# Patient Record
Sex: Male | Born: 1970 | Race: Black or African American | Hispanic: No | Smoking: Current some day smoker
Health system: Southern US, Community
[De-identification: ages and names within clinical notes are randomized; demographics above are authoritative.]

## PROBLEM LIST (undated history)

## (undated) DIAGNOSIS — M549 Dorsalgia, unspecified: Secondary | ICD-10-CM

## (undated) HISTORY — PX: NO PAST SURGERIES: SHX2092

## (undated) HISTORY — DX: Dorsalgia, unspecified: M54.9

---

## 2016-02-09 ENCOUNTER — Emergency Department (HOSPITAL_COMMUNITY): Payer: Self-pay

## 2016-02-09 ENCOUNTER — Encounter (HOSPITAL_COMMUNITY): Payer: Self-pay | Admitting: Emergency Medicine

## 2016-02-09 ENCOUNTER — Emergency Department (HOSPITAL_COMMUNITY)
Admission: EM | Admit: 2016-02-09 | Discharge: 2016-02-09 | Disposition: A | Discharge status: Home / Self Care | Payer: Self-pay | Attending: Physician Assistant | Admitting: Physician Assistant

## 2016-02-09 DIAGNOSIS — M545 Low back pain: Secondary | ICD-10-CM | POA: Insufficient documentation

## 2016-02-09 DIAGNOSIS — M546 Pain in thoracic spine: Secondary | ICD-10-CM | POA: Insufficient documentation

## 2016-02-09 DIAGNOSIS — Y999 Unspecified external cause status: Secondary | ICD-10-CM | POA: Insufficient documentation

## 2016-02-09 DIAGNOSIS — R930 Abnormal findings on diagnostic imaging of skull and head, not elsewhere classified: Secondary | ICD-10-CM | POA: Insufficient documentation

## 2016-02-09 DIAGNOSIS — W108XXA Fall (on) (from) other stairs and steps, initial encounter: Secondary | ICD-10-CM | POA: Insufficient documentation

## 2016-02-09 DIAGNOSIS — Y929 Unspecified place or not applicable: Secondary | ICD-10-CM | POA: Insufficient documentation

## 2016-02-09 DIAGNOSIS — Y939 Activity, unspecified: Secondary | ICD-10-CM | POA: Insufficient documentation

## 2016-02-09 DIAGNOSIS — W19XXXA Unspecified fall, initial encounter: Secondary | ICD-10-CM

## 2016-02-09 MED ORDER — CYCLOBENZAPRINE HCL 10 MG PO TABS: 10.0000 mg | ORAL_TABLET | Freq: Two times a day (BID) | ORAL | 0 refills | Status: DC | PRN

## 2016-02-09 MED ORDER — OXYCODONE-ACETAMINOPHEN 5-325 MG PO TABS
1.0000 | ORAL_TABLET | Freq: Once | ORAL | Status: AC
  Administered 2016-02-09: 1 via ORAL
  Filled 2016-02-09: qty 1

## 2016-02-09 MED ORDER — IBUPROFEN 800 MG PO TABS: 800.0000 mg | ORAL_TABLET | Freq: Three times a day (TID) | ORAL | 0 refills | Status: DC

## 2016-02-09 NOTE — Discharge Instructions (Signed)
You were seen today after fall. We believe you have multiple muscle sprains. Please use his muscle relaxer and ibuprofen to help with the pain.

## 2016-02-09 NOTE — ED Provider Notes (Signed)
[facility]ENCY DEPT Provider Note   CSN: [id number] Arrival date & time: 02/09/16  1024     History   Chief Complaint Chief Complaint  Patient presents with  . Fall    HPI Kenneth Hogan is a 46 y.o. male.  HPI   Patient is a [age] her old male who slipped and fell down 2 flights of stairs. Patient reports he fell down and lost urinary continence. Denies any weakness currently. No change in sensation. Patient does have pain in his back.  No loss of consciousness.  History reviewed. No pertinent past medical history.  There are no active problems to display for this patient.   History reviewed. No pertinent surgical history.     Home Medications    Prior to Admission medications   Medication Sig Start Date End Date Taking? Authorizing Provider  cyclobenzaprine (FLEXERIL) 10 MG tablet Take 1 tablet (10 mg total) by mouth 2 (two) times daily as needed for muscle spasms. 02/09/16   Angely Dietz Lyn Rune Mendez, MD  ibuprofen (ADVIL,MOTRIN) 800 MG tablet Take 1 tablet (800 mg total) by mouth 3 (three) times daily. 02/09/16   Bennette Hasty Lyn Balraj Brayfield, MD    Family History History reviewed. No pertinent family history.  Social History Social History  Substance Use Topics  . Smoking status: Never Smoker  . Smokeless tobacco: Never Used  . Alcohol use Not on file     Allergies   Patient has no allergy information on record.   Review of Systems Review of Systems  Constitutional: Negative for fever.  Musculoskeletal: Positive for back pain. Negative for gait problem.  Neurological: Negative for dizziness.     Physical Exam Updated Vital Signs BP 113/86 (BP Location: Left Arm)   Pulse 62   Resp 18   SpO2 100%   Physical Exam  Constitutional: He is oriented to person, place, and time. He appears well-nourished.  HENT:  Head: Normocephalic.  Eyes: Conjunctivae are normal.  Cardiovascular: Normal rate.   Pulmonary/Chest: Effort normal and breath sounds normal.    Abdominal: Soft. There is no tenderness.  Genitourinary: Penis normal.  Genitourinary Comments: Normal sensation.  Musculoskeletal: He exhibits no edema.  Pains T and L-spine.  Neurological: He is oriented to person, place, and time.  Equal strength bilaterally upper and lower extremities negative pronator drift. Normal sensation bilaterally. Speech comprehensible, no slurring. Facial nerve tested and appears grossly normal. Alert and oriented 3.   Sensation to sacrum and genitals. Normal sensation bilateral lower extremities.  Skin: Skin is warm and dry. He is not diaphoretic.  Psychiatric: He has a normal mood and affect. His behavior is normal.     ED Treatments / Results  Labs (all labs ordered are listed, but only abnormal results are displayed) Labs Reviewed - No data to display  EKG  EKG Interpretation None       Radiology Ct Head Wo Contrast  Result Date: 02/09/2016 CLINICAL DATA:  Slipped and fell down 2 flights of stairs. EXAM: CT HEAD WITHOUT CONTRAST TECHNIQUE: Contiguous axial images were obtained from the base of the skull through the vertex without intravenous contrast. COMPARISON:  None. FINDINGS: Brain: No evidence of malformation, atrophy, old or acute small or large vessel infarction, mass lesion, hemorrhage, hydrocephalus or extra-axial collection. No evidence of pituitary lesion. Vascular: No vascular calcification.  No hyperdense vessels. Skull: Normal.  No fracture or focal bone lesion. Sinuses/Orbits: Visualized sinuses are clear. No fluid in the middle ears or mastoids. Visualized orbits are normal. Other: None  significant IMPRESSION: Normal head CT Electronically Signed   By: Paulina Fusi M.D.   On: 02/09/2016 13:00   Ct Cervical Spine Wo Contrast  Result Date: 02/09/2016 CLINICAL DATA:  Slipped and fell down 2 flights of stairs. EXAM: CT CERVICAL SPINE WITHOUT CONTRAST TECHNIQUE: Multidetector CT imaging of the cervical spine was performed without  intravenous contrast. Multiplanar CT image reconstructions were also generated. COMPARISON:  None. FINDINGS: Alignment: Normal Skull base and vertebrae: No fracture or focal bone lesion. Soft tissues and spinal canal: Negative Disc levels: C2-3: Spondylosis with mild foraminal encroachment by osteophytes. C3-4: Spondylosis with foraminal encroachment by osteophytes left more than right. C4-5: Spondylosis with osteophytic encroachment upon the canal and the foramina right more than left. C5-6: Spondylosis with mild foraminal encroachment upon the foramina. C6-7: Spondylosis with mild foraminal encroachment left more than right. C7-T1:  Tiny osteophytes without stenosis. Upper chest: Emphysema.  No acute finding. Other: None significant IMPRESSION: No acute or traumatic finding.  Chronic degenerative spondylosis. Electronically Signed   By: Paulina Fusi M.D.   On: 02/09/2016 13:06   Ct Thoracic Spine Wo Contrast  Result Date: 02/09/2016 CLINICAL DATA:  Larey Seat down 2 flights of stairs.  Back pain. EXAM: CT THORACIC SPINE WITHOUT CONTRAST TECHNIQUE: Multidetector CT images of the thoracic were obtained using the standard protocol without intravenous contrast. COMPARISON:  None. FINDINGS: Alignment: Normal Vertebrae: No fracture or primary bone lesion. Paraspinal and other soft tissues: Negative. Emphysema noted in the upper lobes. Disc levels: No significant degenerative disc disease. Mild facet degeneration and ligamentous hypertrophy at T3-4. IMPRESSION: Negative thoracic study. Electronically Signed   By: Paulina Fusi M.D.   On: 02/09/2016 13:07   Ct Lumbar Spine Wo Contrast  Result Date: 02/09/2016 CLINICAL DATA:  Larey Seat down two flights of stairs.  Back pain. EXAM: CT LUMBAR SPINE WITHOUT CONTRAST TECHNIQUE: Multidetector CT imaging of the lumbar spine was performed without intravenous contrast administration. Multiplanar CT image reconstructions were also generated. COMPARISON:  None. FINDINGS: Segmentation: 5  lumbar type vertebral bodies. Alignment: Normal Vertebrae: No fracture or focal lesion. Paraspinal and other soft tissues: Nonobstructing left renal stone. Some atherosclerotic calcification of the aorta and iliac arteries. Disc levels: Disc bulges at L3-4, L4-5 and L5-S1. Lower lumbar facet arthropathy. Bilateral sacroiliac osteoarthritis. IMPRESSION: No acute or traumatic finding. Lower lumbar disc bulges and facet arthropathy. Sacroiliac osteoarthritis. Aortic atherosclerosis. Left renal stone. Electronically Signed   By: Paulina Fusi M.D.   On: 02/09/2016 13:10    Procedures Procedures (including critical care time)  Medications Ordered in ED Medications  oxyCODONE-acetaminophen (PERCOCET/ROXICET) 5-325 MG per tablet 1 tablet (1 tablet Oral Given 02/09/16 1355)     Initial Impression / Assessment and Plan / ED Course  I have reviewed the triage vital signs and the nursing notes.  Pertinent labs & imaging results that were available during my care of the patient were reviewed by me and considered in my medical decision making (see chart for details).     Patient is a 46 year old male who had a mechanical fall today. Patient fell down stairs after slipping on ice. Patient reports no weakness. Pain to the T and L-spine. Normal neurologic exam. Patient did urinate on himself but has normal sensation .   We'll get CT  CT and L-spine.  We not suspect spinal cord injury given normal neurologic exam and no symptoms.  NOrmal CTs, patietn ambulatory without issue.  Patient is comfortable, ambulatory, and taking PO at time of discharge.  Patient  expressed understanding about return precautions.   Final Clinical Impressions(s) / ED Diagnoses   Final diagnoses:  Fall, initial encounter    New Prescriptions Discharge Medication List as of 02/09/2016  2:37 PM    START taking these medications   Details  cyclobenzaprine (FLEXERIL) 10 MG tablet Take 1 tablet (10 mg total) by mouth 2 (two)  times daily as needed for muscle spasms., Starting Fri 02/09/2016, Print    ibuprofen (ADVIL,MOTRIN) 800 MG tablet Take 1 tablet (800 mg total) by mouth 3 (three) times daily., Starting Fri 02/09/2016, Print         Siyah Mault Randall An, MD 02/09/16 1539

## 2018-07-29 IMAGING — CT CT CERVICAL SPINE W/O CM
4 of 8 series · 13 of 33 positions shown, 14 images · non-contrast
Comparison: None.

CLINICAL DATA: Slipped and fell down 2 flights of stairs.

EXAM:
CT CERVICAL SPINE WITHOUT CONTRAST
TECHNIQUE: Multidetector CT imaging of the cervical spine was performed without
intravenous contrast. Multiplanar CT image reconstructions were also
generated.

[Series 203: coronal st, idose (1) · coronal · 0.40mm/px · 2 of 79 slices shown]
[im 27/79  bone]
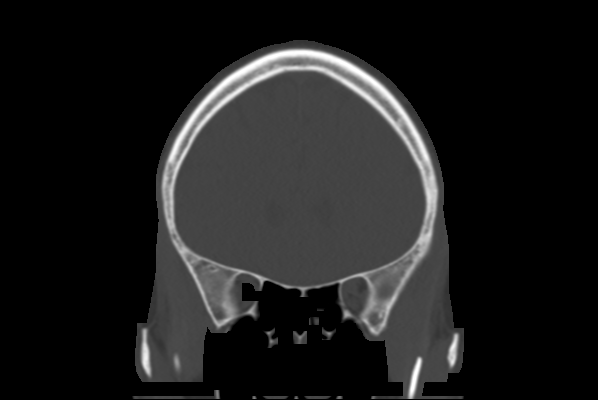
[im 53/79  bone]
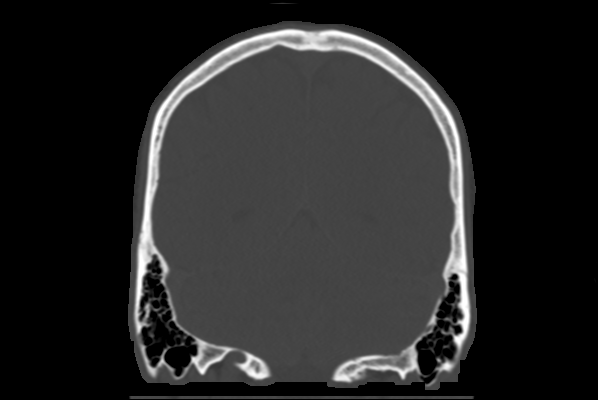

[Series 302: soft tissue, idose (2) · axial · 0.39mm/px · z∈[+125,+231]mm · 3 of 107 slices shown]
[im 27/107  soft-tissue]
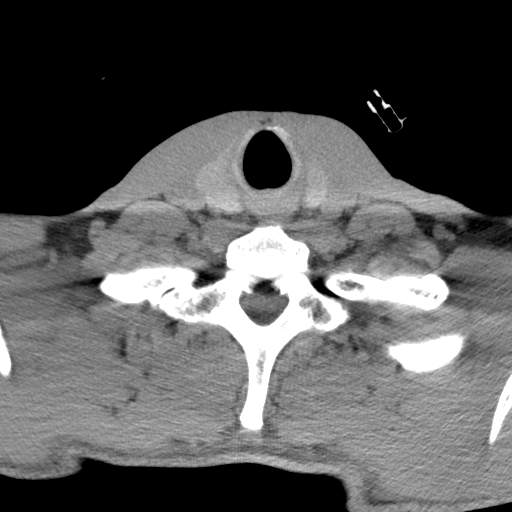
[im 54/107  soft-tissue]
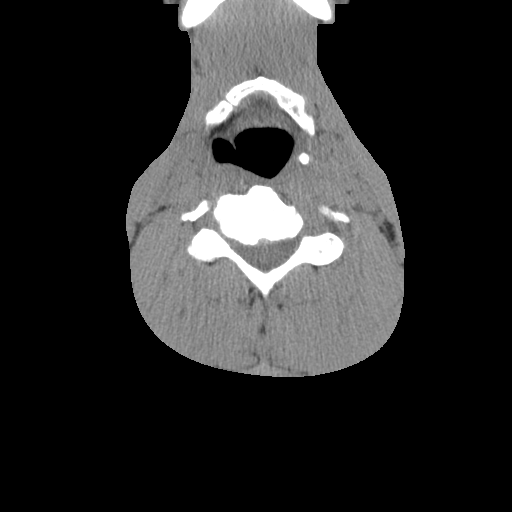
[im 80/107  soft-tissue]
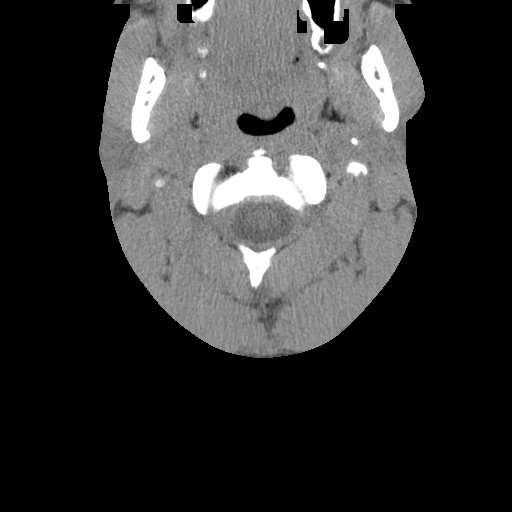

[Series 305: sagittal, idose (2) · sagittal · 0.34mm/px · 5 of 100 slices shown]
[im 17/100  bone]
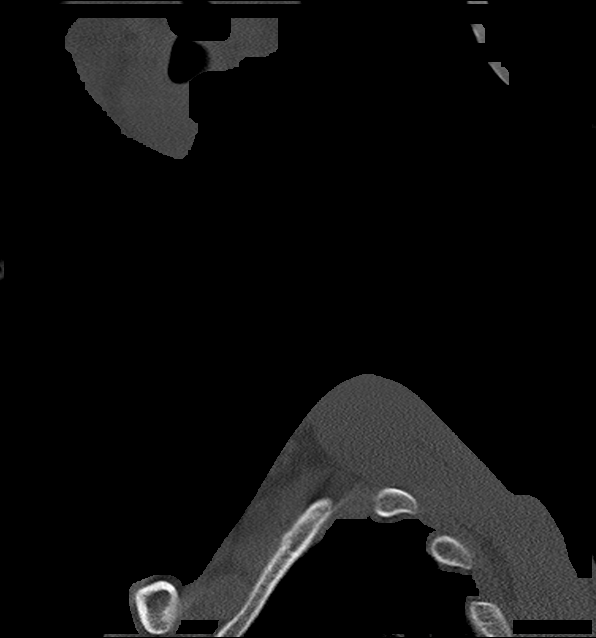
[im 34/100  bone]
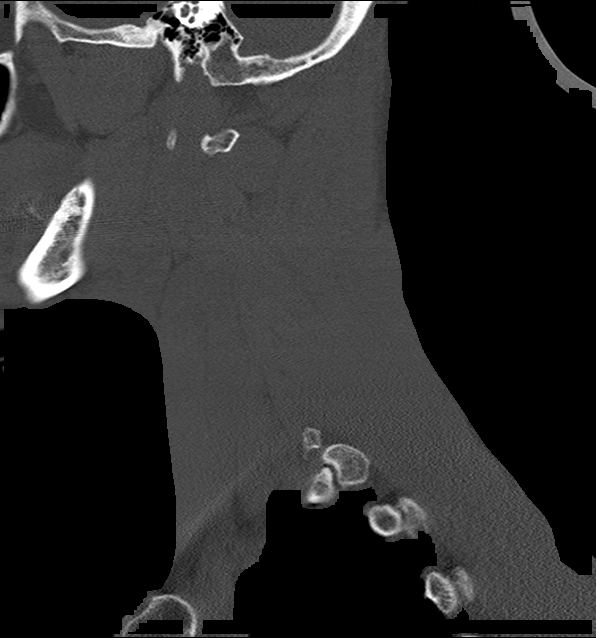
[im 50/100  bone]
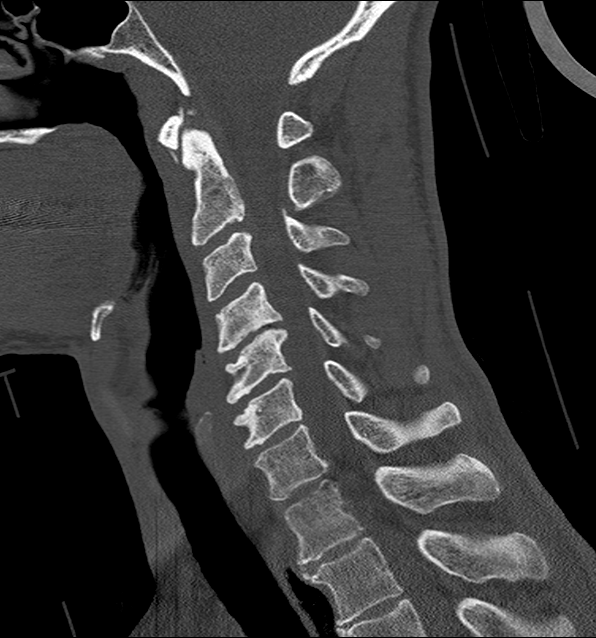
[im 67/100  bone]
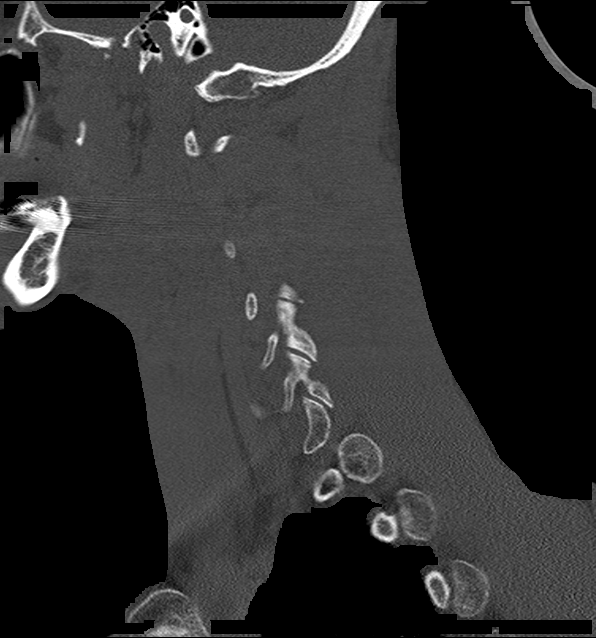
[im 83/100  bone]
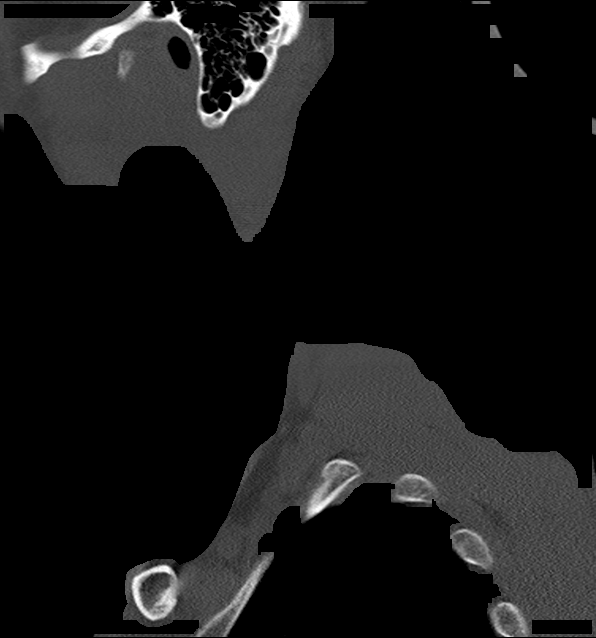

[Series 306: orthogonals, idose (2) · axial · 0.53mm/px · z∈[+88,+184]mm · 3 of 107 slices shown, 4 images]
[im 27/107  soft-tissue]
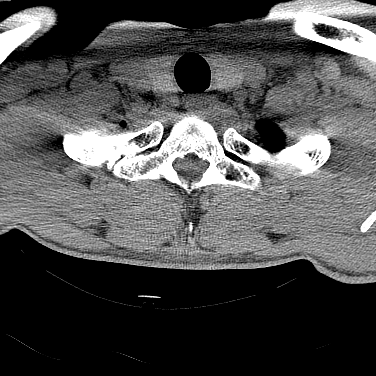
[im 27/107  bone]
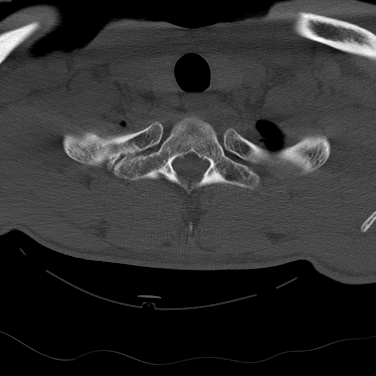
[im 54/107  bone]
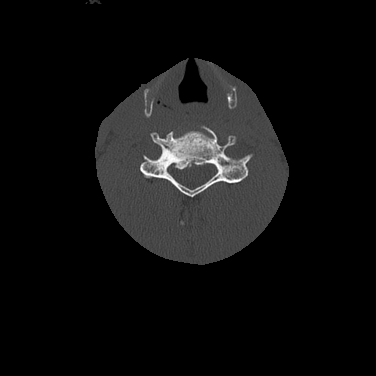
[im 80/107  bone]
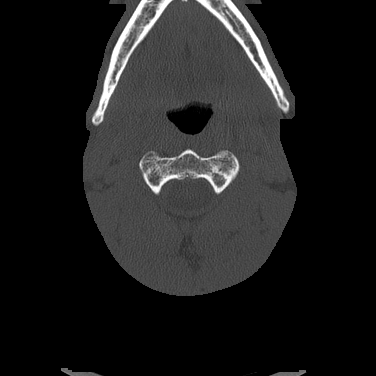

[13 of 33 positions shown; findings below may reference images not displayed]

FINDINGS: Alignment: Normal

Skull base and vertebrae: No fracture or focal bone lesion.

Soft tissues and spinal canal: Negative

Disc levels: C2-3: Spondylosis with mild foraminal encroachment by
osteophytes.

C3-4: Spondylosis with foraminal encroachment by osteophytes left
more than right.

C4-5: Spondylosis with osteophytic encroachment upon the canal and
the foramina right more than left.

C5-6: Spondylosis with mild foraminal encroachment upon the
foramina.

C6-7: Spondylosis with mild foraminal encroachment left more than
right.

C7-T1:  Tiny osteophytes without stenosis.

Upper chest: Emphysema.  No acute finding.

Other: None significant
IMPRESSION: No acute or traumatic finding.  Chronic degenerative spondylosis.

## 2018-10-12 ENCOUNTER — Encounter: Payer: Self-pay | Admitting: Nurse Practitioner

## 2018-10-12 ENCOUNTER — Ambulatory Visit: Payer: Self-pay | Attending: Nurse Practitioner | Admitting: Nurse Practitioner

## 2018-10-12 ENCOUNTER — Ambulatory Visit: Payer: Self-pay

## 2018-10-12 ENCOUNTER — Other Ambulatory Visit: Payer: Self-pay

## 2018-10-12 VITALS — Ht 72.0 in

## 2018-10-12 DIAGNOSIS — Z130 Encounter for screening for diseases of the blood and blood-forming organs and certain disorders involving the immune mechanism: Secondary | ICD-10-CM

## 2018-10-12 DIAGNOSIS — Z13220 Encounter for screening for lipoid disorders: Secondary | ICD-10-CM

## 2018-10-12 DIAGNOSIS — Z131 Encounter for screening for diabetes mellitus: Secondary | ICD-10-CM

## 2018-10-12 DIAGNOSIS — F17200 Nicotine dependence, unspecified, uncomplicated: Secondary | ICD-10-CM

## 2018-10-12 DIAGNOSIS — Z7689 Persons encountering health services in other specified circumstances: Secondary | ICD-10-CM

## 2018-10-12 DIAGNOSIS — G8929 Other chronic pain: Secondary | ICD-10-CM

## 2018-10-12 DIAGNOSIS — M545 Low back pain: Secondary | ICD-10-CM

## 2018-10-12 MED ORDER — NAPROXEN 500 MG PO TABS: 500.0000 mg | ORAL_TABLET | Freq: Two times a day (BID) | ORAL | 0 refills | Status: AC

## 2018-10-12 MED ORDER — GABAPENTIN 300 MG PO CAPS: 600.0000 mg | ORAL_CAPSULE | Freq: Three times a day (TID) | ORAL | 1 refills | Status: DC

## 2018-10-12 MED ORDER — DULOXETINE HCL 30 MG PO CPEP: 30.0000 mg | ORAL_CAPSULE | Freq: Every day | ORAL | 3 refills | Status: DC

## 2018-10-12 MED FILL — NAPROXEN 500 MG TABLET: 500 | 30 days supply | Qty: 60 | Fill #0

## 2018-10-12 MED FILL — DULoxetine HCL 30 MG CPEP: 30 | 30 days supply | Qty: 30 | Fill #0

## 2018-10-12 MED FILL — GABAPENTIN 300 MG CAPSULE: 300 | 30 days supply | Qty: 180 | Fill #0

## 2018-10-12 NOTE — Progress Notes (Signed)
Virtual Visit via Telephone Note Due to national recommendations of social distancing due to [place] 19, telehealth visit is felt to be most appropriate for this patient at this time.  I discussed the limitations, risks, security and privacy concerns of performing an evaluation and management service by telephone and the availability of in person appointments. I also discussed with the patient that there may be a patient responsible charge related to this service. The patient expressed understanding and agreed to proceed.    I connected with Almetta Lovely on 10/12/18  at   1:50 PM EDT  EDT by telephone and verified that I am speaking with the correct person using two identifiers.   Consent I discussed the limitations, risks, security and privacy concerns of performing an evaluation and management service by telephone and the availability of in person appointments. I also discussed with the patient that there may be a patient responsible charge related to this service. The patient expressed understanding and agreed to proceed.   Location of Patient: Private Residence    Location of Provider: [place] and [employer] Office    Persons participating in Telemedicine visit: Geryl Rankins FNP-BC [place]    History of Present Illness: Telemedicine visit for: Establish Care Denies any history HTN, HPL, DM or thyroid disorder He has been receiving [facility] at the [facility]   Chronic Back Pain Lower back. Onset over 15 years ago. He has applied for disability in the past and was denied. Currently uninsured. 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 He denies any significant trauma or injury in the past. Continuous nagging sharp pain.   Taking gabapentin and ibuprofen 800 mg for his back pain with little relief of pain. He has also tried muscle relaxants in the past with no relief.  Aggravating factors: twisting, prolonged walking and sitting. Sometimes  has to hold on the walls when back pain occurs as legs also get weak. He does endorses history of falls. He does not use a cane. He denies any burning, numbness or tingling.   He does have an ED visit note from 02-09-2016: fell down 2 flights of stair and lost control of urine as well.   CT LUMBAR SPINE Paraspinal and other soft tissues: Nonobstructing left renal stone. Some atherosclerotic calcification of the aorta and iliac arteries.  Disc levels: Disc bulges at L3-4, L4-5 and L5-S1. Lower lumbar facet arthropathy. Bilateral sacroiliac osteoarthritis.  IMPRESSION: No acute or traumatic finding. Lower lumbar disc bulges and facet arthropathy. Sacroiliac osteoarthritis.  Aortic atherosclerosis.  Left renal stone.   CT THORACIC SPINE Disc levels: No significant degenerative disc disease. Mild facet degeneration and ligamentous hypertrophy at T3-4.   CT Cervical Spine No acute or traumatic finding.  Chronic degenerative spondylosis.  Past Medical History:  Diagnosis Date  . Back pain     Past Surgical History:  Procedure Laterality Date  . NO PAST SURGERIES      Family History  Problem Relation Age of Onset  . Hypertension Neg Hx   . Diabetes Neg Hx     Social History   Socioeconomic History  . Marital status: Unknown    Spouse name: Not on file  . Number of children: Not on file  . Years of education: Not on file  . Highest education level: Not on file  Occupational History  . Not on file  Social Needs  . Financial resource strain: Not on file  . Food  insecurity    Worry: Not on file    Inability: Not on file  . Transportation needs    Medical: Not on file    Non-medical: Not on file  Tobacco Use  . Smoking status: Current Some Day Smoker    Types: Cigarettes  . Smokeless tobacco: Never Used  Substance and Sexual Activity  . Alcohol use: Not Currently  . Drug use: Not Currently  . Sexual activity: Yes  Lifestyle  . Physical activity    Days per  week: Not on file    Minutes per session: Not on file  . Stress: Not on file  Relationships  . Social Herbalist on phone: Not on file    Gets together: Not on file    Attends religious service: Not on file    Active member of club or organization: Not on file    Attends meetings of clubs or organizations: Not on file    Relationship status: Not on file  Other Topics Concern  . Not on file  Social History Narrative  . Not on file     Observations/Objective: Awake, alert and oriented x 3   Review of Systems  Constitutional: Negative for fever, malaise/fatigue and weight loss.  HENT: Negative.  Negative for nosebleeds.   Eyes: Negative.  Negative for blurred vision, double vision and photophobia.  Respiratory: Negative.  Negative for cough and shortness of breath.   Cardiovascular: Negative.  Negative for chest pain, palpitations and leg swelling.  Gastrointestinal: Negative.  Negative for heartburn, nausea and vomiting.  Musculoskeletal: Positive for back pain and falls. Negative for myalgias.  Neurological: Positive for sensory change. Negative for dizziness, focal weakness, seizures and headaches.  Psychiatric/Behavioral: Negative.  Negative for suicidal ideas.    Assessment and Plan: Merrick was seen today for new patient (initial visit).  Diagnoses and all orders for this visit:  Encounter to establish care -     CMP14+EGFR; Future  Screening for deficiency anemia -     CBC; Future  Lipid screening -     Lipid panel; Future  Diabetes mellitus screening -     Hemoglobin A1c; Future  Tobacco dependence  Chronic bilateral low back pain without sciatica -     DULoxetine (CYMBALTA) 30 MG capsule; Take 1 capsule (30 mg total) by mouth daily. -     gabapentin (NEURONTIN) 300 MG capsule; Take 2 capsules (600 mg total) by mouth 3 (three) times daily. -     naproxen (NAPROSYN) 500 MG tablet; Take 1 tablet (500 mg total) by mouth 2 (two) times daily with a meal.  CAN NOT TAKE WITH IBUPROFEN Work on losing weight to help reduce back pain. May alternate with heat and ice application for pain relief. May also alternate with acetaminophen as prescribed for back pain. Other alternatives include massage, acupuncture and water aerobics.  You must stay active and avoid a sedentary lifestyle.     Follow Up Instructions Return in about 6 weeks (around 11/23/2018) for BACK PAIN; , Needs appointment with financial representative..     I discussed the assessment and treatment plan with the patient. The patient was provided an opportunity to ask questions and all were answered. The patient agreed with the plan and demonstrated an understanding of the instructions.   The patient was advised to call back or seek an in-person evaluation if the symptoms worsen or if the condition fails to improve as anticipated.  I provided 24 minutes of non-face-to-face time  during this encounter including median intraservice time, reviewing previous notes, labs, imaging, medications and explaining diagnosis and management.  Zelda W Fleming, FNP-BC 

## 2018-10-13 LAB — CMP14+EGFR
ALT: 23 IU/L (ref 0–44)
AST: 23 IU/L (ref 0–40)
Albumin/Globulin Ratio: 2.2 (ref 1.2–2.2)
Albumin: 4.4 g/dL (ref 4.0–5.0)
Alkaline Phosphatase: 121 IU/L — ABNORMAL HIGH (ref 39–117)
BUN/Creatinine Ratio: 11 (ref 9–20)
BUN: 11 mg/dL (ref 6–24)
Bilirubin Total: 0.6 mg/dL (ref 0.0–1.2)
CO2: 27 mmol/L (ref 20–29)
Calcium: 9.6 mg/dL (ref 8.7–10.2)
Chloride: 100 mmol/L (ref 96–106)
Creatinine, Ser: 1 mg/dL (ref 0.76–1.27)
GFR calc Af Amer: 102 mL/min/{1.73_m2} (ref >59)
GFR calc non Af Amer: 88 mL/min/{1.73_m2} (ref >59)
Globulin, Total: 2 g/dL (ref 1.5–4.5)
Glucose: 93 mg/dL (ref 65–99)
Potassium: 4.9 mmol/L (ref 3.5–5.2)
Sodium: 140 mmol/L (ref 134–144)
Total Protein: 6.4 g/dL (ref 6.0–8.5)

## 2018-10-13 LAB — LIPID PANEL
Chol/HDL Ratio: 2.4 ratio (ref 0.0–5.0)
Cholesterol, Total: 188 mg/dL (ref 100–199)
HDL: 77 mg/dL (ref >39)
LDL Chol Calc (NIH): 82 mg/dL (ref 0–99)
Triglycerides: 178 mg/dL — ABNORMAL HIGH (ref 0–149)
VLDL Cholesterol Cal: 29 mg/dL (ref 5–40)

## 2018-10-13 LAB — HEMOGLOBIN A1C
Est. average glucose Bld gHb Est-mCnc: 111 mg/dL
Hgb A1c MFr Bld: 5.5 % (ref 4.8–5.6)

## 2018-10-13 LAB — CBC
Hematocrit: 50.3 % (ref 37.5–51.0)
Hemoglobin: 16.5 g/dL (ref 13.0–17.7)
MCH: 30.8 pg (ref 26.6–33.0)
MCHC: 32.8 g/dL (ref 31.5–35.7)
MCV: 94 fL (ref 79–97)
Platelets: 244 10*3/uL (ref 150–450)
RBC: 5.35 x10E6/uL (ref 4.14–5.80)
RDW: 13.2 % (ref 11.6–15.4)
WBC: 6.7 10*3/uL (ref 3.4–10.8)

## 2018-10-21 ENCOUNTER — Ambulatory Visit: Payer: Medicaid Other

## 2018-10-21 ENCOUNTER — Telehealth: Payer: Self-pay | Admitting: Nurse Practitioner

## 2018-10-21 ENCOUNTER — Ambulatory Visit: Payer: Self-pay | Attending: Family Medicine

## 2018-10-21 ENCOUNTER — Other Ambulatory Visit: Payer: Self-pay

## 2018-10-21 NOTE — Telephone Encounter (Signed)
I ca the Pt at 9am to try to go over the interview for the CAFA and OC program, LVM to Pt that I will try again in 10 minutes and he does not answer the appt will count as a No show

## 2018-10-21 NOTE — Telephone Encounter (Signed)
I call the Pt to remind him that he has a 9am tele visit with financial, LVM

## 2018-11-05 ENCOUNTER — Telehealth: Payer: Self-pay | Admitting: Nurse Practitioner

## 2018-11-05 NOTE — Telephone Encounter (Signed)
Pt was sent a letter from financial dept. Inform them, that the application they submitted was incomplete, since they were missing some documentation at the time of the appointment, Pt need to reschedule and resubmit all new papers and application for CAFA and OC, P.S. old documents has been sent back by mail to the Pt and Pt. need to make a new appt. 

## 2019-08-30 ENCOUNTER — Emergency Department (HOSPITAL_COMMUNITY)
Admission: EM | Admit: 2019-08-30 | Discharge: 2019-08-30 | Disposition: A | Discharge status: Home / Self Care | Payer: Self-pay | Attending: Emergency Medicine | Admitting: Emergency Medicine

## 2019-08-30 ENCOUNTER — Encounter (HOSPITAL_COMMUNITY): Payer: Self-pay | Admitting: Emergency Medicine

## 2019-08-30 ENCOUNTER — Other Ambulatory Visit: Payer: Self-pay

## 2019-08-30 ENCOUNTER — Emergency Department (HOSPITAL_COMMUNITY): Payer: Self-pay

## 2019-08-30 DIAGNOSIS — M79604 Pain in right leg: Secondary | ICD-10-CM | POA: Insufficient documentation

## 2019-08-30 DIAGNOSIS — M79651 Pain in right thigh: Secondary | ICD-10-CM | POA: Insufficient documentation

## 2019-08-30 DIAGNOSIS — M5432 Sciatica, left side: Secondary | ICD-10-CM | POA: Insufficient documentation

## 2019-08-30 DIAGNOSIS — M79652 Pain in left thigh: Secondary | ICD-10-CM | POA: Insufficient documentation

## 2019-08-30 DIAGNOSIS — G8929 Other chronic pain: Secondary | ICD-10-CM | POA: Insufficient documentation

## 2019-08-30 DIAGNOSIS — M79605 Pain in left leg: Secondary | ICD-10-CM | POA: Insufficient documentation

## 2019-08-30 DIAGNOSIS — R531 Weakness: Secondary | ICD-10-CM | POA: Insufficient documentation

## 2019-08-30 DIAGNOSIS — F17210 Nicotine dependence, cigarettes, uncomplicated: Secondary | ICD-10-CM | POA: Insufficient documentation

## 2019-08-30 DIAGNOSIS — M5431 Sciatica, right side: Secondary | ICD-10-CM | POA: Insufficient documentation

## 2019-08-30 MED ORDER — HYDROCODONE-ACETAMINOPHEN 5-325 MG PO TABS
1.0000 | ORAL_TABLET | Freq: Once | ORAL | Status: AC
  Administered 2019-08-30: 1 via ORAL
  Filled 2019-08-30: qty 1

## 2019-08-30 MED ORDER — KETOROLAC TROMETHAMINE 60 MG/2ML IM SOLN
60.0000 mg | Freq: Once | INTRAMUSCULAR | Status: AC
  Administered 2019-08-30: 60 mg via INTRAMUSCULAR
  Filled 2019-08-30: qty 2

## 2019-08-30 MED ORDER — METHOCARBAMOL 500 MG PO TABS: 500.0000 mg | ORAL_TABLET | Freq: Two times a day (BID) | ORAL | 0 refills | Status: DC

## 2019-08-30 MED ORDER — CYCLOBENZAPRINE HCL 10 MG PO TABS
10.0000 mg | ORAL_TABLET | Freq: Once | ORAL | Status: AC
  Administered 2019-08-30: 10 mg via ORAL
  Filled 2019-08-30: qty 1

## 2019-08-30 NOTE — Discharge Planning (Signed)
Leo Fray J. Lucretia Roers, RN, BSN, [place] [phone]  RNCM set up appointment with [facility] on 9/14 @ 2:30.  Spoke with pt at bedside and advised to please arrive 15 min early and take a picture ID and your current medications.  Pt verbalizes understanding of keeping appointment.

## 2019-08-30 NOTE — ED Triage Notes (Signed)
Pt. Stated, Kenneth Hogan had back pain for a week and radiates to my butt and my legs.

## 2019-08-30 NOTE — ED Provider Notes (Addendum)
[facility] EMERGENCY DEPARTMENT Provider Note   CSN: [id number] Arrival date & time: 08/30/19  2202     History Chief Complaint  Patient presents with  . Back Pain    Estle Huguley is a 49 y.o. male.  The history is provided by the patient.  Back Pain Location:  Gluteal region Quality:  Shooting Radiates to:  R thigh and L thigh Pain severity:  Moderate Pain is:  Same all the time Duration:  2 days Timing:  Constant Progression:  Worsening Chronicity:  Recurrent Context: falling   Relieved by:  Nothing Worsened by:  Ambulation, bending and movement Ineffective treatments:  None tried Associated symptoms: no abdominal pain, no chest pain, no dysuria and no fever   Risk factors: no hx of cancer, no hx of osteoporosis and no lack of exercise    49 year old male with history of chronic back pain presents with acute on chronic back pain.  Patient states that he has been having left-sided back pain for the past few years and had an MRI done back in 2017 which showed "spinal stenosis".  Patient states that he fell couple days ago and landed on his hands and since then he has been having worsening left-sided as well as right-sided back pain that radiates down the back of both of his legs.  He also endorses tingling sensation in his bilateral lower extremities as well as weakness difficulty ambulating secondary to pain and weakness.  Denies bladder, bowel incontinence, IV drug use, history of cancer, recent direct trauma to the back.    Past Medical History:  Diagnosis Date  . Back pain     There are no problems to display for this patient.   Past Surgical History:  Procedure Laterality Date  . NO PAST SURGERIES         Family History  Problem Relation Age of Onset  . Hypertension Neg Hx   . Diabetes Neg Hx     Social History   Tobacco Use  . Smoking status: Current Some Day Smoker    Types: Cigarettes  . Smokeless tobacco: Never Used  Vaping Use   . Vaping Use: Never used  Substance Use Topics  . Alcohol use: Not Currently  . Drug use: Not Currently    Home Medications Prior to Admission medications   Medication Sig Start Date End Date Taking? Authorizing Provider  cyclobenzaprine (FLEXERIL) 10 MG tablet Take 1 tablet (10 mg total) by mouth 2 (two) times daily as needed for muscle spasms. Patient not taking: Reported on 10/12/2018 02/09/16   Mackuen, Courteney Lyn, MD  DULoxetine (CYMBALTA) 30 MG capsule Take 1 capsule (30 mg total) by mouth daily. 10/12/18 11/11/18  Claiborne Rigg, NP  gabapentin (NEURONTIN) 300 MG capsule Take 2 capsules (600 mg total) by mouth 3 (three) times daily. 10/12/18 11/11/18  Claiborne Rigg, NP  methocarbamol (ROBAXIN) 500 MG tablet Take 1 tablet (500 mg total) by mouth 2 (two) times daily. 08/30/19   Rickey Primus, MD    Allergies    Patient has no known allergies.  Review of Systems   Review of Systems  Constitutional: Negative for chills and fever.  HENT: Negative for ear pain and sore throat.   Eyes: Negative for pain and visual disturbance.  Respiratory: Negative for cough and shortness of breath.   Cardiovascular: Negative for chest pain and palpitations.  Gastrointestinal: Negative for abdominal pain and vomiting.  Genitourinary: Negative for dysuria and hematuria.  Musculoskeletal: Positive for  back pain. Negative for arthralgias.  Skin: Negative for color change and rash.  Neurological: Negative for seizures and syncope.  All other systems reviewed and are negative.   Physical Exam Updated Vital Signs BP 132/82 (BP Location: Right Arm)   Pulse 74   Temp 98.7 F (37.1 C) (Oral)   Resp 16   Ht 6' (1.829 m)   Wt 77.1 kg   SpO2 100%   BMI 23.06 kg/m   Physical Exam Vitals and nursing note reviewed.  Constitutional:      Appearance: He is well-developed.  HENT:     Head: Normocephalic and atraumatic.  Eyes:     Conjunctiva/sclera: Conjunctivae normal.  Cardiovascular:      Rate and Rhythm: Normal rate and regular rhythm.     Heart sounds: No murmur heard.   Pulmonary:     Effort: Pulmonary effort is normal. No respiratory distress.     Breath sounds: Normal breath sounds.  Abdominal:     Palpations: Abdomen is soft.     Tenderness: There is no abdominal tenderness.  Musculoskeletal:     Cervical back: Neck supple.  Skin:    General: Skin is warm and dry.  Neurological:     Mental Status: He is alert and oriented to person, place, and time.     Motor: Weakness (4/5 strength in bl LE.) present.     Comments: Decreased posterior sensation in BLE.  No saddle anesthesia.       ED Results / Procedures / Treatments   Labs (all labs ordered are listed, but only abnormal results are displayed) Labs Reviewed - No data to display  EKG None  Radiology MR LUMBAR SPINE WO CONTRAST  Result Date: 08/30/2019 CLINICAL DATA:  Low back and right leg pain worsening over the past week. EXAM: MRI LUMBAR SPINE WITHOUT CONTRAST TECHNIQUE: Multiplanar, multisequence MR imaging of the lumbar spine was performed. No intravenous contrast was administered. COMPARISON:  CT scan 02/09/2016 FINDINGS: Examination is limited. Patient was in a lot of pain and could not continue the exam. There are only 2 reasonable sequences which is the T2 and T1 sagittals. There are no axial images. Segmentation: There are five lumbar type vertebral bodies. The last full intervertebral disc space is labeled L5-S1. Alignment:  Normal Vertebrae:  Normal marrow signal. No bone lesions or fractures. Conus medullaris and cauda equina: Conus extends to the bottom of L1 level. Conus and cauda equina appear normal. Paraspinal and other soft tissues: No significant paraspinal findings Disc levels: L1-2: No significant findings. L2-3: Bulging annulus with mild impression on the thecal sac. L3-4: Bulging annulus with mild impression on the thecal sac. L4-5: Annular fissure and shallow central disc protrusion.  There is also an underlying bulging annulus. Mild mass effect on the thecal sac. L5-S1: Moderate to large central and right paracentral disc protrusion likely causing the patient's symptoms. No significant foraminal stenosis. IMPRESSION: 1. Limited examination as discussed above. 2. Moderate to large central and right paracentral disc protrusion at L5-S1 likely causing the patient's symptoms. 3. Annular fissure and shallow central disc protrusion at L4-5. Electronically Signed   By: Rudie Meyer M.D.   On: 08/30/2019 12:43    Procedures Procedures (including critical care time)  Medications Ordered in ED Medications  cyclobenzaprine (FLEXERIL) tablet 10 mg (10 mg Oral Given 08/30/19 1031)  ketorolac (TORADOL) injection 60 mg (60 mg Intramuscular Given 08/30/19 1031)  HYDROcodone-acetaminophen (NORCO/VICODIN) 5-325 MG per tablet 1 tablet (1 tablet Oral Given 08/30/19 1158)  ED Course  I have reviewed the triage vital signs and the nursing notes.  Pertinent labs & imaging results that were available during my care of the patient were reviewed by me and considered in my medical decision making (see chart for details).    MDM Rules/Calculators/A&P                          49 year old male presents with acute on chronic back pain.  Afebrile vital signs stable.  Exam as above.  Exam most notable for weakness as well as decreased sensation in the bilateral lower extremities.  Upon chart review patient had an MRI done back in 2018 that showed disc bulge at L3-L4, L4-L5, L5-S1.  Patient was scheduled to follow-up however stated that this was not financially possible for him.  Given patient's weakness as well as altered sensation on physical exam feel it is reasonable to repeat an L spine MRI.    MRI showed moderate to large central and right paracentral disc protrusion at L5-S1.  No evidence of foraminal stenosis or cauda equina on MRI.  Reevaluation patient endorsed improved pain however still  complaining of low back pain.  Spoke with social worker who helps set up an appointment for the patient on 9/14.  Do not feel that patient requires urgent spine consult at this time given minimal weakness and no evidence of stenosis on MRI.  Patient will be discharged with ibuprofen as well as Robaxin for his pain and he was encouraged to follow-up with his primary care physician.  Patient was also given strict ED return precautions.  Patient voiced agreement and understanding's overall plan.  Patient was discharged in stable condition without further events.  Final Clinical Impression(s) / ED Diagnoses Final diagnoses:  Bilateral sciatica    Rx / DC Orders ED Discharge Orders         Ordered    methocarbamol (ROBAXIN) 500 MG tablet  2 times daily     Discontinue  Reprint     08/30/19 1306           Rickey Primus, MD 08/30/19 1348    Rickey Primus, MD 08/30/19 1401    Gerhard Munch, MD 08/31/19 1016

## 2019-08-30 NOTE — ED Notes (Signed)
Pt transported to MRI 

## 2019-08-30 NOTE — Discharge Instructions (Addendum)
Please take one tablet of robaxin for back pain when needed and please follow up with your PCP at the appt noted above.  Please also take 400mg  of Ibuprofen every 8 hours for pain.

## 2019-10-05 ENCOUNTER — Encounter: Payer: Self-pay | Admitting: Nurse Practitioner

## 2019-10-05 ENCOUNTER — Ambulatory Visit: Payer: Self-pay | Attending: Nurse Practitioner | Admitting: Nurse Practitioner

## 2019-10-05 DIAGNOSIS — F17210 Nicotine dependence, cigarettes, uncomplicated: Secondary | ICD-10-CM | POA: Insufficient documentation

## 2019-10-05 DIAGNOSIS — M5126 Other intervertebral disc displacement, lumbar region: Secondary | ICD-10-CM | POA: Insufficient documentation

## 2019-10-05 DIAGNOSIS — R531 Weakness: Secondary | ICD-10-CM | POA: Insufficient documentation

## 2019-10-05 DIAGNOSIS — G8929 Other chronic pain: Secondary | ICD-10-CM | POA: Insufficient documentation

## 2019-10-05 DIAGNOSIS — M545 Low back pain: Secondary | ICD-10-CM | POA: Insufficient documentation

## 2019-10-05 MED ORDER — DULOXETINE HCL 60 MG PO CPEP: 60.0000 mg | ORAL_CAPSULE | Freq: Every day | ORAL | 1 refills | Status: AC

## 2019-10-05 MED ORDER — METHOCARBAMOL 750 MG PO TABS
750.0000 mg | ORAL_TABLET | Freq: Two times a day (BID) | ORAL | 1 refills | Status: AC
Start: 2019-10-05 — End: 2019-11-04

## 2019-10-05 MED ORDER — GABAPENTIN 300 MG PO CAPS: 600.0000 mg | ORAL_CAPSULE | Freq: Three times a day (TID) | ORAL | 1 refills | Status: AC

## 2019-10-05 MED FILL — DULoxetine HCL 60 MG CPEP: 60 | 30 days supply | Qty: 30 | Fill #0

## 2019-10-05 MED FILL — METHOCARBAMOL 750 MG TABS: 750 | 30 days supply | Qty: 60 | Fill #0

## 2019-10-05 MED FILL — GABAPENTIN 300 MG CAPSULE: 300 | 30 days supply | Qty: 180 | Fill #0

## 2019-10-05 NOTE — Progress Notes (Signed)
Virtual Visit via Telephone Note Due to national recommendations of social distancing due to COVID 19, telehealth visit is felt to be most appropriate for this patient at this time.  I discussed the limitations, risks, security and privacy concerns of performing an evaluation and management service by telephone and the availability of in person appointments. I also discussed with the patient that there may be a patient responsible charge related to this service. The patient expressed understanding and agreed to proceed.    I connected with Kenneth Hogan on 10/05/19  at   2:30 PM EDT  EDT by telephone and verified that I am speaking with the correct person using two identifiers.   Consent I discussed the limitations, risks, security and privacy concerns of performing an evaluation and management service by telephone and the availability of in person appointments. I also discussed with the patient that there may be a patient responsible charge related to this service. The patient expressed understanding and agreed to proceed.   Location of Patient: Private Residence    Location of Provider: [facility]    Persons participating in Telemedicine visit: Kenneth Denver FNP-BC YY Veyo CMA Eulis Belmar    History of Present Illness: Telemedicine visit for: Re Establish Care  He has a history of chronic back pain with bilateral sciatica.  Onset over 15 years ago. 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 He fell down a flight of stairs in 2018. He did fall last month and believes this may have exacerbated his pain. Will need to see the spine specialist once he is approved for CAFA. He can not afford to pay OOP. Pain is described as sharp. Aggravating factors: twisting, prolonged walking and sitting. Has to hold on to the walls when pain back occurs as well as weakness in legs. Denies any involuntary loss of urine of stool  MRI of  Lumbar Spine 08-2019 2. Moderate to large central and right paracentral disc protrusion at L5-S1 likely causing the patient's symptoms. 3. Annular fissure and shallow central disc protrusion at L4-5.  Medications tried in the past: Cymbalta, gabapentin, flexeril, naproxen. He can not recall if these were beneficial or not in relieving his back pain as he was lost to follow up. He was recently prescribed methocarbamol however he states he did not pick this up as it was not available at his pharmacy.  Past Medical History:  Diagnosis Date   Back pain     Past Surgical History:  Procedure Laterality Date   NO PAST SURGERIES      Family History  Problem Relation Age of Onset   Hypertension Neg Hx    Diabetes Neg Hx     Social History   Socioeconomic History   Marital status: Unknown    Spouse name: Not on file   Number of children: Not on file   Years of education: Not on file   Highest education level: Not on file  Occupational History   Not on file  Tobacco Use   Smoking status: Current Some Day Smoker    Types: Cigarettes   Smokeless tobacco: Never Used  Vaping Use   Vaping Use: Never used  Substance and Sexual Activity   Alcohol use: Not Currently   Drug use: Not Currently   Sexual activity: Yes  Other Topics Concern   Not on file  Social History Narrative   Not on file   Social Determinants  of Health   Financial Resource Strain:    Difficulty of Paying Living Expenses: Not on file  Food Insecurity:    Worried About Running Out of Food in the Last Year: Not on file   Ran Out of Food in the Last Year: Not on file  Transportation Needs:    Lack of Transportation (Medical): Not on file   Lack of Transportation (Non-Medical): Not on file  Physical Activity:    Days of Exercise per Week: Not on file   Minutes of Exercise per Session: Not on file  Stress:    Feeling of Stress : Not on file  Social Connections:    Frequency of  Communication with Friends and Family: Not on file   Frequency of Social Gatherings with Friends and Family: Not on file   Attends Religious Services: Not on file   Active Member of Clubs or Organizations: Not on file   Attends Banker Meetings: Not on file   Marital Status: Not on file     Observations/Objective: Awake, alert and oriented x 3   Review of Systems  Constitutional: Negative for fever, malaise/fatigue and weight loss.  HENT: Negative.  Negative for nosebleeds.   Eyes: Negative.  Negative for blurred vision, double vision and photophobia.  Respiratory: Negative.  Negative for cough and shortness of breath.   Cardiovascular: Negative.  Negative for chest pain, palpitations and leg swelling.  Gastrointestinal: Negative.  Negative for heartburn, nausea and vomiting.  Genitourinary: Negative.   Musculoskeletal: Positive for back pain. Negative for myalgias.  Neurological: Positive for sensory change. Negative for dizziness, focal weakness, seizures and headaches.  Psychiatric/Behavioral: Negative.  Negative for suicidal ideas.    Assessment and Plan: Kenneth Hogan was seen today for back pain.  Diagnoses and all orders for this visit:  Chronic bilateral low back pain without sciatica -     DULoxetine (CYMBALTA) 60 MG capsule; Take 1 capsule (60 mg total) by mouth daily. -     gabapentin (NEURONTIN) 300 MG capsule; Take 2 capsules (600 mg total) by mouth 3 (three) times daily. -     methocarbamol (ROBAXIN) 750 MG tablet; Take 1 tablet (750 mg total) by mouth 2 (two) times daily. Work on losing weight to help reduce back pain. May alternate with heat and ice application for pain relief. May also alternate with acetaminophen as prescribed for back pain. Other alternatives include massage, acupuncture and water aerobics.  You must stay active and avoid a sedentary lifestyle.     Follow Up Instructions Return in about 6 weeks (around 11/16/2019).     I discussed  the assessment and treatment plan with the patient. The patient was provided an opportunity to ask questions and all were answered. The patient agreed with the plan and demonstrated an understanding of the instructions.   The patient was advised to call back or seek an in-person evaluation if the symptoms worsen or if the condition fails to improve as anticipated.  I provided 19 minutes of non-face-to-face time during this encounter including median intraservice time, reviewing previous notes, labs, imaging, medications and explaining diagnosis and management.  Claiborne Rigg, FNP-BC

## 2019-10-06 ENCOUNTER — Other Ambulatory Visit: Payer: Self-pay

## 2019-10-06 ENCOUNTER — Ambulatory Visit: Payer: Self-pay | Attending: Family Medicine

## 2019-10-06 DIAGNOSIS — Z7689 Persons encountering health services in other specified circumstances: Secondary | ICD-10-CM

## 2019-10-06 DIAGNOSIS — Z13220 Encounter for screening for lipoid disorders: Secondary | ICD-10-CM

## 2019-10-06 DIAGNOSIS — Z13228 Encounter for screening for other metabolic disorders: Secondary | ICD-10-CM

## 2019-10-06 DIAGNOSIS — Z130 Encounter for screening for diseases of the blood and blood-forming organs and certain disorders involving the immune mechanism: Secondary | ICD-10-CM

## 2019-10-06 DIAGNOSIS — Z114 Encounter for screening for human immunodeficiency virus [HIV]: Secondary | ICD-10-CM

## 2019-10-06 DIAGNOSIS — Z1211 Encounter for screening for malignant neoplasm of colon: Secondary | ICD-10-CM

## 2019-10-06 DIAGNOSIS — Z131 Encounter for screening for diabetes mellitus: Secondary | ICD-10-CM

## 2019-10-06 DIAGNOSIS — Z1159 Encounter for screening for other viral diseases: Secondary | ICD-10-CM

## 2019-10-07 LAB — CBC
Hematocrit: 50.9 % (ref 37.5–51.0)
Hemoglobin: 16.9 g/dL (ref 13.0–17.7)
MCH: 30.3 pg (ref 26.6–33.0)
MCHC: 33.2 g/dL (ref 31.5–35.7)
MCV: 91 fL (ref 79–97)
Platelets: 257 10*3/uL (ref 150–450)
RBC: 5.58 x10E6/uL (ref 4.14–5.80)
RDW: 12.7 % (ref 11.6–15.4)
WBC: 4.7 10*3/uL (ref 3.4–10.8)

## 2019-10-07 LAB — CMP14+EGFR
ALT: 17 IU/L (ref 0–44)
AST: 18 IU/L (ref 0–40)
Albumin/Globulin Ratio: 1.9 (ref 1.2–2.2)
Albumin: 5 g/dL (ref 4.0–5.0)
Alkaline Phosphatase: 141 IU/L — ABNORMAL HIGH (ref 44–121)
BUN/Creatinine Ratio: 13 (ref 9–20)
BUN: 14 mg/dL (ref 6–24)
Bilirubin Total: 0.4 mg/dL (ref 0.0–1.2)
CO2: 26 mmol/L (ref 20–29)
Calcium: 10.2 mg/dL (ref 8.7–10.2)
Chloride: 100 mmol/L (ref 96–106)
Creatinine, Ser: 1.04 mg/dL (ref 0.76–1.27)
GFR calc Af Amer: 96 mL/min/{1.73_m2} (ref >59)
GFR calc non Af Amer: 83 mL/min/{1.73_m2} (ref >59)
Globulin, Total: 2.6 g/dL (ref 1.5–4.5)
Glucose: 87 mg/dL (ref 65–99)
Potassium: 5 mmol/L (ref 3.5–5.2)
Sodium: 141 mmol/L (ref 134–144)
Total Protein: 7.6 g/dL (ref 6.0–8.5)

## 2019-10-07 LAB — HEPATITIS C ANTIBODY: Hep C Virus Ab: <0.1 s/co ratio (ref 0.0–0.9)

## 2019-10-07 LAB — HEMOGLOBIN A1C
Est. average glucose Bld gHb Est-mCnc: 111 mg/dL
Hgb A1c MFr Bld: 5.5 % (ref 4.8–5.6)

## 2019-10-07 LAB — LIPID PANEL
Chol/HDL Ratio: 2.7 ratio (ref 0.0–5.0)
Cholesterol, Total: 221 mg/dL — ABNORMAL HIGH (ref 100–199)
HDL: 82 mg/dL (ref >39)
LDL Chol Calc (NIH): 117 mg/dL — ABNORMAL HIGH (ref 0–99)
Triglycerides: 126 mg/dL (ref 0–149)
VLDL Cholesterol Cal: 22 mg/dL (ref 5–40)

## 2019-10-07 LAB — HIV ANTIBODY (ROUTINE TESTING W REFLEX): HIV Screen 4th Generation wRfx: NONREACTIVE

## 2019-10-11 ENCOUNTER — Other Ambulatory Visit: Payer: Self-pay | Admitting: Nurse Practitioner

## 2019-10-11 MED ORDER — ATORVASTATIN CALCIUM 20 MG PO TABS: 20.0000 mg | ORAL_TABLET | Freq: Every day | ORAL | 3 refills | Status: AC

## 2019-10-12 ENCOUNTER — Telehealth: Payer: Self-pay | Admitting: *Deleted

## 2019-10-12 NOTE — Telephone Encounter (Signed)
Patient requested call from Financial Counselor, Mikle Bosworth regarding questions about the information needed to submit packet.   Pls advise.

## 2019-10-13 NOTE — Telephone Encounter (Signed)
Pt was call and explain what he need to collect to apply for the CAFA and OC program

## 2019-11-16 ENCOUNTER — Ambulatory Visit: Payer: Medicaid Other | Admitting: Nurse Practitioner

## 2019-12-12 ENCOUNTER — Emergency Department (HOSPITAL_COMMUNITY)
Admission: EM | Admit: 2019-12-12 | Discharge: 2019-12-12 | Disposition: A | Discharge status: Home / Self Care | Payer: Medicaid Other | Attending: Emergency Medicine | Admitting: Emergency Medicine

## 2019-12-12 ENCOUNTER — Other Ambulatory Visit: Payer: Self-pay

## 2019-12-12 ENCOUNTER — Encounter (HOSPITAL_COMMUNITY): Payer: Self-pay | Admitting: *Deleted

## 2019-12-12 DIAGNOSIS — M5136 Other intervertebral disc degeneration, lumbar region: Secondary | ICD-10-CM

## 2019-12-12 DIAGNOSIS — F17210 Nicotine dependence, cigarettes, uncomplicated: Secondary | ICD-10-CM | POA: Insufficient documentation

## 2019-12-12 DIAGNOSIS — M545 Low back pain, unspecified: Secondary | ICD-10-CM | POA: Insufficient documentation

## 2019-12-12 MED ORDER — LIDOCAINE 5 % EX PTCH
2.0000 | MEDICATED_PATCH | CUTANEOUS | Status: DC
  Administered 2019-12-12: 2 via TRANSDERMAL
  Filled 2019-12-12: qty 2

## 2019-12-12 MED ORDER — KETOROLAC TROMETHAMINE 60 MG/2ML IM SOLN
60.0000 mg | Freq: Once | INTRAMUSCULAR | Status: AC
  Administered 2019-12-12: 60 mg via INTRAMUSCULAR
  Filled 2019-12-12: qty 2

## 2019-12-12 MED ORDER — LIDOCAINE 5 % EX PTCH: 1.0000 | MEDICATED_PATCH | CUTANEOUS | 0 refills | Status: AC

## 2019-12-12 MED ORDER — MELOXICAM 7.5 MG PO TABS: 7.5000 mg | ORAL_TABLET | Freq: Every day | ORAL | 0 refills | Status: AC

## 2019-12-12 NOTE — ED Provider Notes (Signed)
[facility] EMERGENCY DEPARTMENT Provider Note   CSN: [id number] Arrival date & time: 12/12/19  0254     History Chief Complaint  Patient presents with  . Back Pain    Kenneth Hogan is a 49 y.o. male.  The history is provided by the patient.  Back Pain Location:  Lumbar spine Quality:  Cramping Radiates to:  R posterior upper leg and L posterior upper leg Pain severity:  Severe Pain is:  Same all the time Onset quality:  Gradual Duration: years  Timing:  Constant Progression:  Worsening Chronicity:  Chronic Context: not emotional stress, not falling, not jumping from heights and not lifting heavy objects   Relieved by:  Nothing Worsened by:  Nothing Ineffective treatments:  None tried Associated symptoms: no abdominal pain, no abdominal swelling, no bladder incontinence, no bowel incontinence, no chest pain, no dysuria, no fever, no headaches, no leg pain, no numbness, no paresthesias, no pelvic pain, no perianal numbness, no tingling, no weakness and no weight loss   Risk factors: no hx of cancer   Recent MRI with DDD.  Has not followed up.  Neurontin is not helping.       Past Medical History:  Diagnosis Date  . Back pain     There are no problems to display for this patient.   Past Surgical History:  Procedure Laterality Date  . NO PAST SURGERIES         Family History  Problem Relation Age of Onset  . Hypertension Neg Hx   . Diabetes Neg Hx     Social History   Tobacco Use  . Smoking status: Current Some Day Smoker    Types: Cigarettes  . Smokeless tobacco: Never Used  Vaping Use  . Vaping Use: Never used  Substance Use Topics  . Alcohol use: Not Currently  . Drug use: Not Currently    Home Medications Prior to Admission medications   Medication Sig Start Date End Date Taking? Authorizing Provider  atorvastatin (LIPITOR) 20 MG tablet Take 1 tablet (20 mg total) by mouth daily. Patient not taking: Reported on 12/12/2019  10/11/19   Claiborne Rigg, NP  cyclobenzaprine (FLEXERIL) 10 MG tablet Take 10 mg by mouth 2 (two) times daily as needed for muscle spasms.  12/09/19   [provider]  DULoxetine (CYMBALTA) 60 MG capsule Take 1 capsule (60 mg total) by mouth daily. Patient not taking: Reported on 12/12/2019 10/05/19 11/04/19  Claiborne Rigg, NP  gabapentin (NEURONTIN) 300 MG capsule Take 2 capsules (600 mg total) by mouth 3 (three) times daily. 10/05/19 12/12/19  Claiborne Rigg, NP  ibuprofen (ADVIL) 800 MG tablet Take 800 mg by mouth 3 (three) times daily as needed. 12/09/19   [provider]    Allergies    Patient has no known allergies.  Review of Systems   Review of Systems  Constitutional: Negative for fever and weight loss.  HENT: Negative for congestion.   Eyes: Negative for visual disturbance.  Respiratory: Negative for shortness of breath.   Cardiovascular: Negative for chest pain.  Gastrointestinal: Negative for abdominal pain and bowel incontinence.  Genitourinary: Negative for bladder incontinence, difficulty urinating, dysuria and pelvic pain.  Musculoskeletal: Positive for back pain.  Neurological: Negative for tingling, weakness, numbness, headaches and paresthesias.  Psychiatric/Behavioral: Negative for agitation.  All other systems reviewed and are negative.   Physical Exam Updated Vital Signs BP 117/82 (BP Location: Left Arm)   Pulse 79   Temp  98.7 F (37.1 C) (Oral)   Resp 18   SpO2 100%   Physical Exam Vitals and nursing note reviewed.  Constitutional:      General: He is not in acute distress.    Appearance: Normal appearance.  HENT:     Head: Normocephalic and atraumatic.     Nose: Nose normal.  Eyes:     Conjunctiva/sclera: Conjunctivae normal.     Pupils: Pupils are equal, round, and reactive to light.  Cardiovascular:     Rate and Rhythm: Normal rate and regular rhythm.     Pulses: Normal pulses.     Heart sounds: Normal heart sounds.    Pulmonary:     Effort: Pulmonary effort is normal.     Breath sounds: Normal breath sounds.  Abdominal:     General: Abdomen is flat. Bowel sounds are normal.     Palpations: Abdomen is soft.     Tenderness: There is no abdominal tenderness. There is no guarding or rebound.  Musculoskeletal:     Cervical back: Normal range of motion and neck supple.  Skin:    General: Skin is warm and dry.     Capillary Refill: Capillary refill takes less than 2 seconds.  Neurological:     General: No focal deficit present.     Mental Status: He is alert and oriented to person, place, and time.     Deep Tendon Reflexes: Reflexes normal.     Comments: 5/5 BLE strength   Psychiatric:        Mood and Affect: Mood normal.        Behavior: Behavior normal.     ED Results / Procedures / Treatments   Labs (all labs ordered are listed, but only abnormal results are displayed) Labs Reviewed - No data to display  EKG None  Radiology No results found.  Procedures Procedures (including critical care time)  Medications Ordered in ED Medications  lidocaine (LIDODERM) 5 % 2 patch (2 patches Transdermal Patch Applied 12/12/19 0528)  ketorolac (TORADOL) injection 60 mg (60 mg Intramuscular Given 12/12/19 0528)    ED Course  I have reviewed the triage vital signs and the nursing notes.  Pertinent labs & imaging results that were available during my care of the patient were reviewed by me and considered in my medical decision making (see chart for details).   Pain is chronic in nature.  Will refer to a spine specialist for ongoing care. No weakness no bowel or bladder symptoms.   NSAIDS in addition to lidoderm and neurontin.  Call to schedule a follow up appointment.    Kenneth Hogan was evaluated in Emergency Department on 12/12/2019 for the symptoms described in the history of present illness. He was evaluated in the context of the global COVID-19 pandemic, which necessitated consideration that the  patient might be at risk for infection with the SARS-CoV-2 virus that causes COVID-19. Institutional protocols and algorithms that pertain to the evaluation of patients at risk for COVID-19 are in a state of rapid change based on information released by regulatory bodies including the CDC and federal and state organizations. These policies and algorithms were followed during the patient's care in the ED.  Final Clinical Impression(s) / ED Diagnoses Return for intractable cough, coughing up blood,fevers >100.4 unrelieved by medication, shortness of breath, intractable vomiting, chest pain, shortness of breath, weakness,numbness, changes in speech, facial asymmetry,abdominal pain, passing out,Inability to tolerate liquids or food, cough, altered mental status or any concerns. No signs of  systemic illness or infection. The patient is nontoxic-appearing on exam and vital signs are within normal limits.   I have reviewed the triage vital signs and the nursing notes. Pertinent labs &imaging results that were available during my care of the patient were reviewed by me and considered in my medical decision making (see chart for details).After history, exam, and medical workup I feel the patient has beenappropriately medically screened and is safe for discharge home. Pertinent diagnoses were discussed with the patient. Patient was given return precautions.      Consuelo Suthers, MD 12/12/19 [phone]

## 2019-12-12 NOTE — ED Triage Notes (Signed)
The pt has chronic back pain for years for the past 2-3 days he has had more back pain

## 2019-12-12 NOTE — ED Notes (Signed)
Patient offered wheelchair. Patient declined.

## 2021-03-30 DIAGNOSIS — M48061 Spinal stenosis, lumbar region without neurogenic claudication: Secondary | ICD-10-CM | POA: Diagnosis not present

## 2021-03-30 DIAGNOSIS — Z1331 Encounter for screening for depression: Secondary | ICD-10-CM | POA: Diagnosis not present

## 2022-02-16 IMAGING — MR MR LUMBAR SPINE W/O CM
3 series · 19 of 48 positions shown · non-contrast
Comparison: CT scan 02/09/2016

CLINICAL DATA: Low back and right leg pain worsening over the past
week.

EXAM:
MRI LUMBAR SPINE WITHOUT CONTRAST
TECHNIQUE: Multiplanar, multisequence MR imaging of the lumbar spine was
performed. No intravenous contrast was administered.

[Series 3: T2 · sagittal · 4.0mm · 0.55mm/px · 10 of 16 slices shown]
[im 2/16]
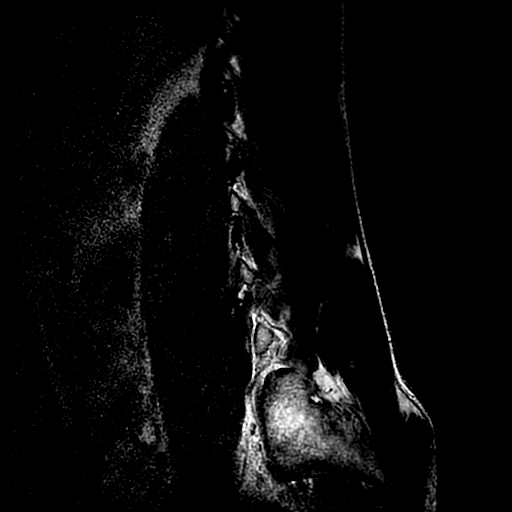
[im 3/16]
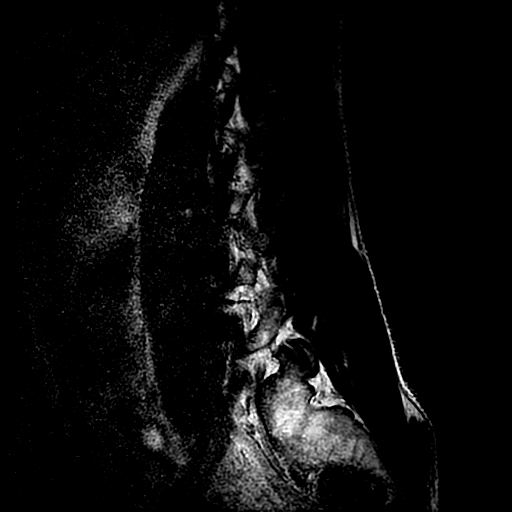
[im 4/16]
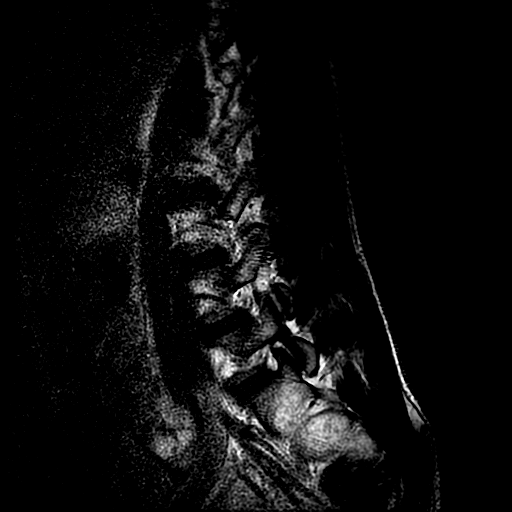
[im 6/16]
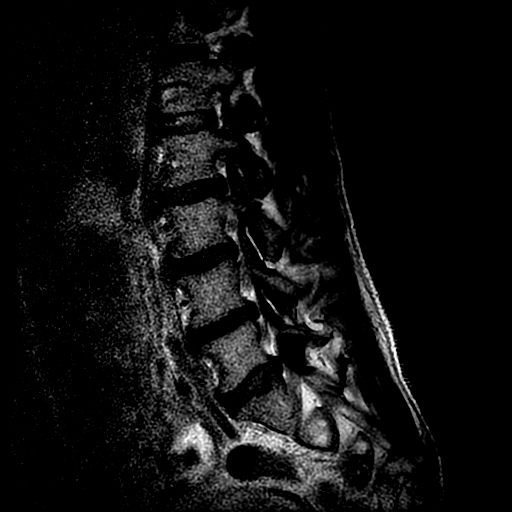
[im 8/16]
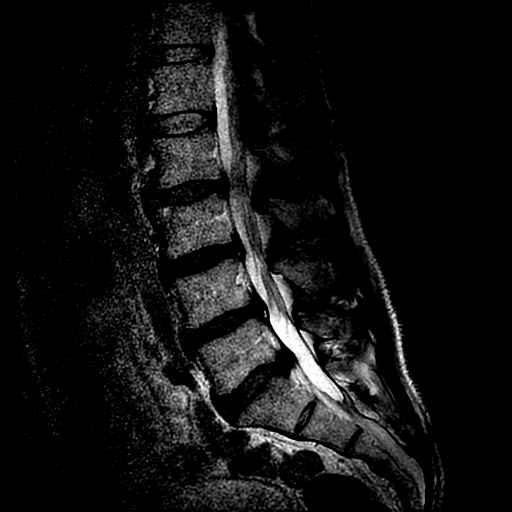
[im 9/16]
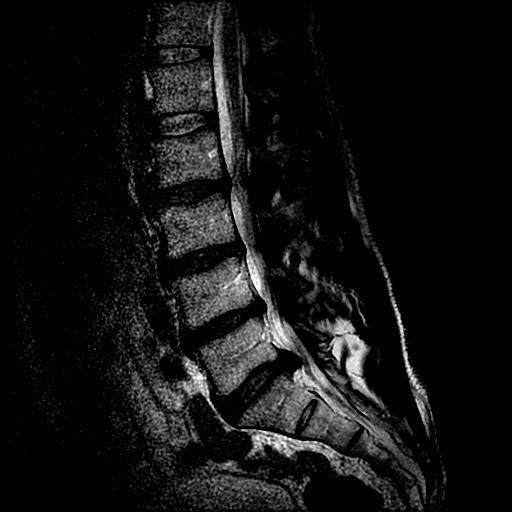
[im 10/16]
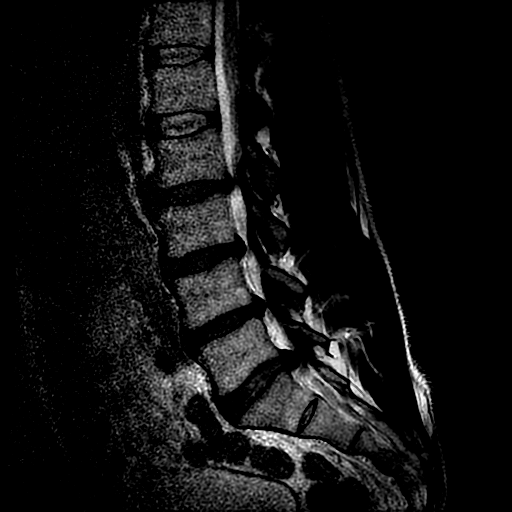
[im 12/16]
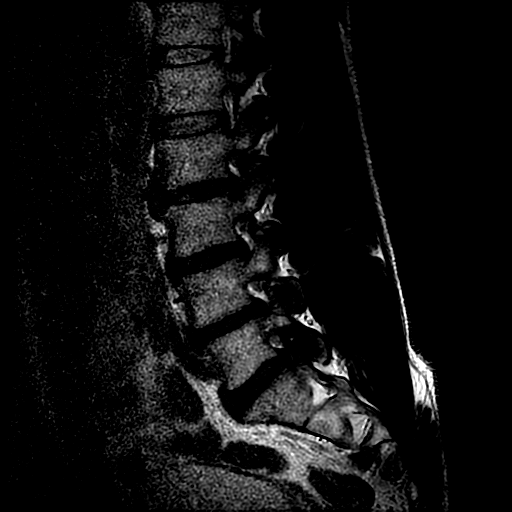
[im 14/16]
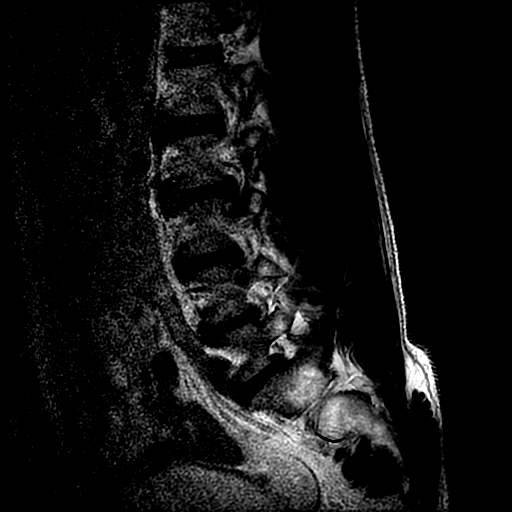
[im 16/16]
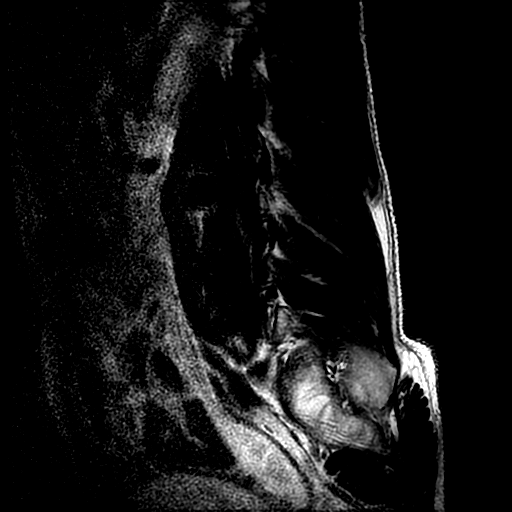

[Series 4: sag ir · sagittal · 4.0mm · 0.55mm/px · 3 of 16 slices shown]
[im 3/16]
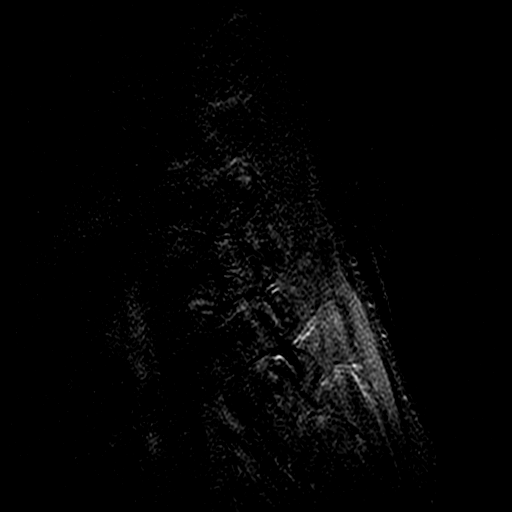
[im 9/16]
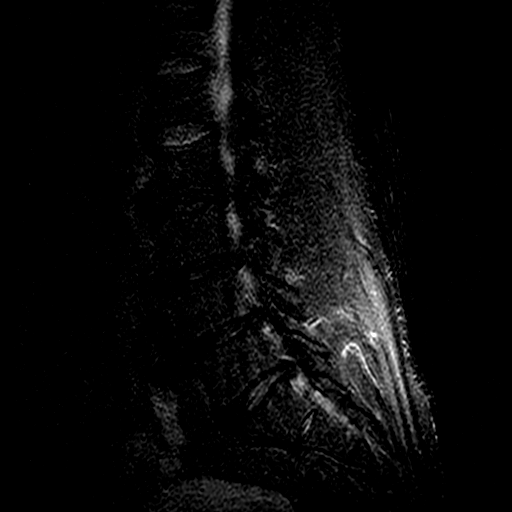
[im 14/16]
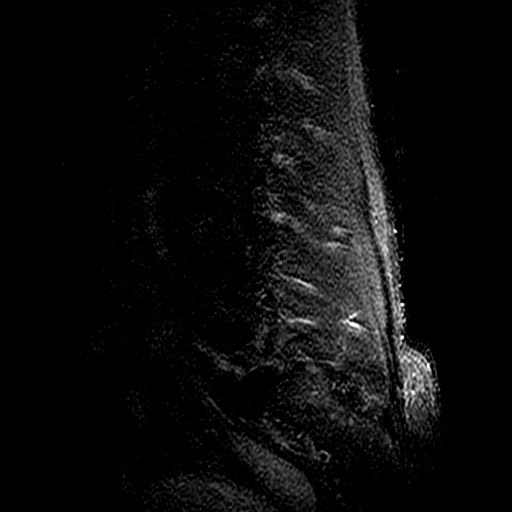

[Series 5: T1 · sagittal · 4.0mm · 0.55mm/px · 6 of 16 slices shown]
[im 2/16]
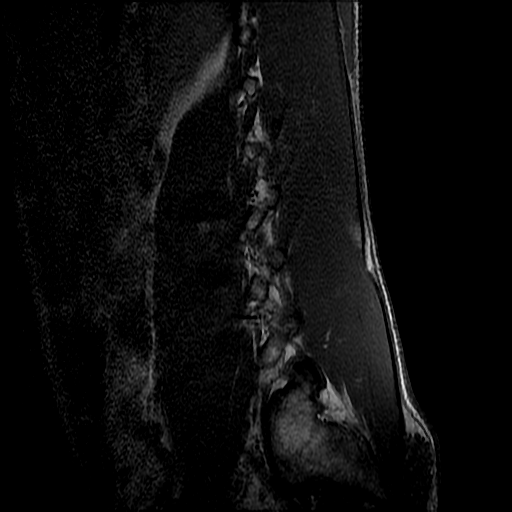
[im 3/16]
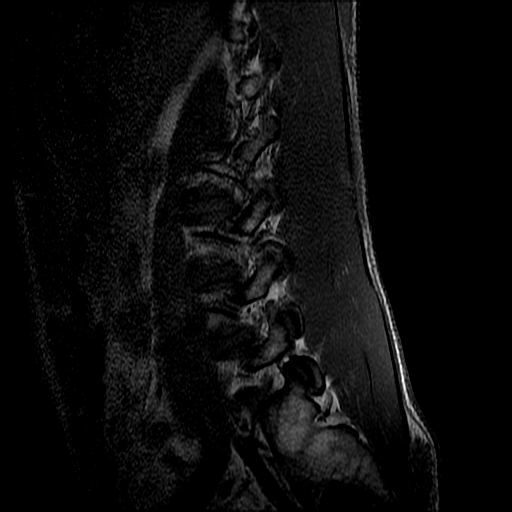
[im 4/16]
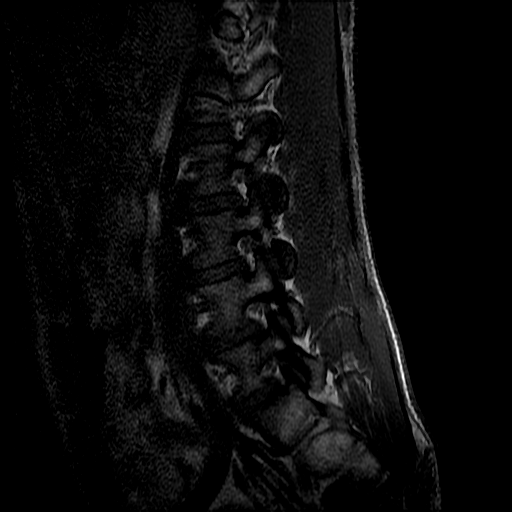
[im 6/16]
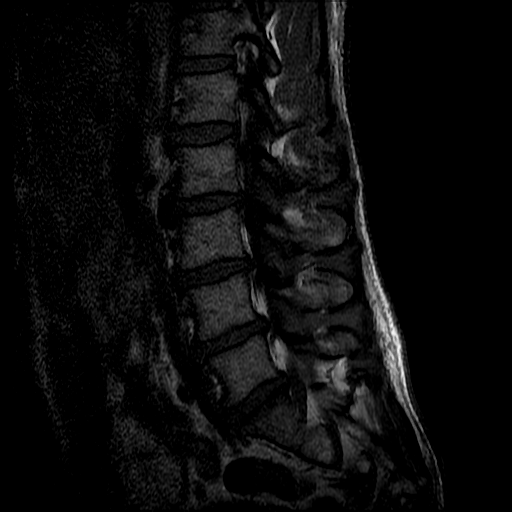
[im 9/16]
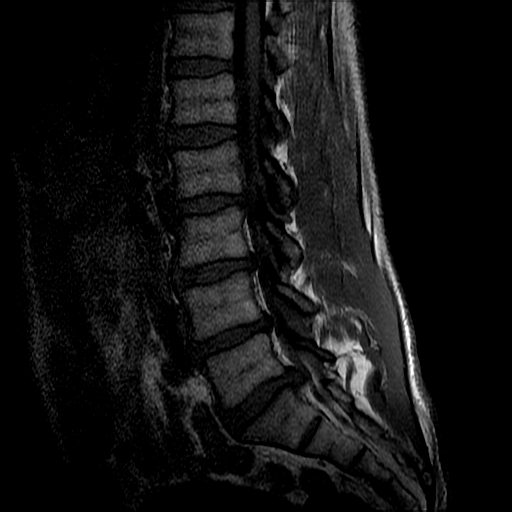
[im 14/16]
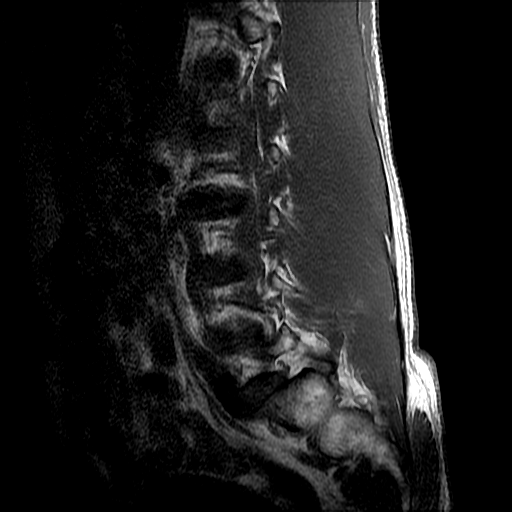

[19 of 48 positions shown; findings below may reference images not displayed]

FINDINGS: Examination is limited. Patient was in a lot of pain and could not
continue the exam. There are only 2 reasonable sequences which is
the T2 and T1 sagittals. There are no axial images.

Segmentation: There are five lumbar type vertebral bodies. The last
full intervertebral disc space is labeled [id number].

Alignment:  Normal

Vertebrae:  Normal marrow signal. No bone lesions or fractures.

Conus medullaris and cauda equina: Conus extends to the bottom of L1
level. Conus and cauda equina appear normal.

Paraspinal and other soft tissues: No significant paraspinal
findings

Disc levels:

L1-2: No significant findings.

L2-3: Bulging annulus with mild impression on the thecal sac.

L3-4: Bulging annulus with mild impression on the thecal sac.

L4-5: Annular fissure and shallow central disc protrusion. There is
also an underlying bulging annulus. Mild mass effect on the thecal
sac.

L5-S1: Moderate to large central and right paracentral disc
protrusion likely causing the patient's symptoms. No significant
foraminal stenosis.
IMPRESSION: 1. Limited examination as discussed above.
2. Moderate to large central and right paracentral disc protrusion
at L5-S1 likely causing the patient's symptoms.
3. Annular fissure and shallow central disc protrusion at L4-5.
